# Patient Record
Sex: Female | Born: 1985 | Race: White | Hispanic: No | Marital: Single | Smoking: Former smoker
Health system: Southern US, Community
[De-identification: ages and names within clinical notes are randomized; demographics above are authoritative.]

## PROBLEM LIST (undated history)

## (undated) DIAGNOSIS — E063 Autoimmune thyroiditis: Secondary | ICD-10-CM

## (undated) HISTORY — PX: KNEE ARTHROSCOPY: SUR90

## (undated) HISTORY — PX: TYMPANOSTOMY TUBE PLACEMENT: SHX32

## (undated) HISTORY — PX: TONSILLECTOMY: SUR1361

---
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
## 2007-05-18 ENCOUNTER — Emergency Department (HOSPITAL_COMMUNITY): Admission: EM | Admit: 2007-05-18 | Discharge: 2007-05-18 | Payer: Self-pay | Admitting: Neurology

## 2007-07-10 ENCOUNTER — Ambulatory Visit: Payer: Self-pay | Admitting: Family Medicine

## 2007-09-19 ENCOUNTER — Emergency Department (HOSPITAL_COMMUNITY): Admission: EM | Admit: 2007-09-19 | Discharge: 2007-09-19 | Payer: Self-pay | Admitting: Emergency Medicine

## 2007-10-01 ENCOUNTER — Encounter (INDEPENDENT_AMBULATORY_CARE_PROVIDER_SITE_OTHER): Payer: Self-pay | Admitting: Family Medicine

## 2007-10-01 DIAGNOSIS — E039 Hypothyroidism, unspecified: Secondary | ICD-10-CM | POA: Insufficient documentation

## 2007-10-01 DIAGNOSIS — G43909 Migraine, unspecified, not intractable, without status migrainosus: Secondary | ICD-10-CM | POA: Insufficient documentation

## 2007-10-19 ENCOUNTER — Emergency Department (HOSPITAL_COMMUNITY): Admission: EM | Admit: 2007-10-19 | Discharge: 2007-10-19 | Payer: Self-pay | Admitting: Family Medicine

## 2008-02-10 ENCOUNTER — Ambulatory Visit: Payer: Self-pay | Admitting: Family Medicine

## 2008-02-10 DIAGNOSIS — G4700 Insomnia, unspecified: Secondary | ICD-10-CM | POA: Insufficient documentation

## 2009-01-19 ENCOUNTER — Emergency Department (HOSPITAL_COMMUNITY): Admission: EM | Admit: 2009-01-19 | Discharge: 2009-01-19 | Payer: Self-pay | Admitting: Emergency Medicine

## 2009-07-06 ENCOUNTER — Emergency Department (HOSPITAL_COMMUNITY): Admission: EM | Admit: 2009-07-06 | Discharge: 2009-07-06 | Payer: Self-pay | Admitting: Family Medicine

## 2009-11-20 ENCOUNTER — Emergency Department (HOSPITAL_COMMUNITY): Admission: EM | Admit: 2009-11-20 | Discharge: 2009-11-20 | Payer: Self-pay | Admitting: Family Medicine

## 2010-01-25 ENCOUNTER — Other Ambulatory Visit: Admission: RE | Admit: 2010-01-25 | Discharge: 2010-01-25 | Payer: Self-pay | Admitting: Family Medicine

## 2010-03-06 ENCOUNTER — Emergency Department (HOSPITAL_COMMUNITY): Admission: EM | Admit: 2010-03-06 | Discharge: 2010-03-06 | Payer: Self-pay | Admitting: Emergency Medicine

## 2010-10-23 ENCOUNTER — Emergency Department (HOSPITAL_COMMUNITY): Admission: EM | Admit: 2010-10-23 | Discharge: 2010-10-23 | Payer: Self-pay | Admitting: Family Medicine

## 2011-04-10 LAB — POCT URINALYSIS DIP (DEVICE)
Hgb urine dipstick: NEGATIVE
Protein, ur: 30 mg/dL — AB
Specific Gravity, Urine: 1.02 (ref 1.005–1.030)
Urobilinogen, UA: 0.2 mg/dL (ref 0.0–1.0)
pH: 5.5 (ref 5.0–8.0)

## 2011-04-10 LAB — POCT PREGNANCY, URINE: Preg Test, Ur: NEGATIVE

## 2011-04-23 ENCOUNTER — Emergency Department (HOSPITAL_COMMUNITY)
Admission: EM | Admit: 2011-04-23 | Discharge: 2011-04-23 | Disposition: A | Discharge status: Home / Self Care | Payer: 59 | Attending: Emergency Medicine | Admitting: Emergency Medicine

## 2011-04-23 ENCOUNTER — Emergency Department (HOSPITAL_COMMUNITY): Payer: 59

## 2011-04-23 DIAGNOSIS — Y92009 Unspecified place in unspecified non-institutional (private) residence as the place of occurrence of the external cause: Secondary | ICD-10-CM | POA: Insufficient documentation

## 2011-04-23 DIAGNOSIS — F3289 Other specified depressive episodes: Secondary | ICD-10-CM | POA: Insufficient documentation

## 2011-04-23 DIAGNOSIS — E039 Hypothyroidism, unspecified: Secondary | ICD-10-CM | POA: Insufficient documentation

## 2011-04-23 DIAGNOSIS — M79609 Pain in unspecified limb: Secondary | ICD-10-CM | POA: Insufficient documentation

## 2011-04-23 DIAGNOSIS — F329 Major depressive disorder, single episode, unspecified: Secondary | ICD-10-CM | POA: Insufficient documentation

## 2011-04-23 DIAGNOSIS — W208XXA Other cause of strike by thrown, projected or falling object, initial encounter: Secondary | ICD-10-CM | POA: Insufficient documentation

## 2011-11-27 ENCOUNTER — Emergency Department (INDEPENDENT_AMBULATORY_CARE_PROVIDER_SITE_OTHER)
Admission: EM | Admit: 2011-11-27 | Discharge: 2011-11-27 | Disposition: A | Discharge status: Home / Self Care | Payer: 59 | Source: Home / Self Care | Attending: Family Medicine | Admitting: Family Medicine

## 2011-11-27 DIAGNOSIS — H6980 Other specified disorders of Eustachian tube, unspecified ear: Secondary | ICD-10-CM

## 2011-11-27 DIAGNOSIS — H9203 Otalgia, bilateral: Secondary | ICD-10-CM

## 2011-11-27 DIAGNOSIS — H9209 Otalgia, unspecified ear: Secondary | ICD-10-CM

## 2011-11-27 HISTORY — DX: Autoimmune thyroiditis: E06.3

## 2011-11-27 MED ORDER — AMOXICILLIN-POT CLAVULANATE 875-125 MG PO TABS: 1.0000 | ORAL_TABLET | Freq: Two times a day (BID) | ORAL | Status: AC

## 2011-11-27 MED ORDER — FLUTICASONE PROPIONATE 50 MCG/ACT NA SUSP: 2.0000 | Freq: Every day | NASAL | Status: DC

## 2011-11-27 NOTE — ED Notes (Signed)
3 day hx of sinus pressure with headache.  coughing with no production. 3 day hx of bilateral ear pain.  Denies fevers.

## 2011-11-27 NOTE — ED Provider Notes (Signed)
History     CSN: [id number] Arrival date & time: 11/27/2011 11:35 AM   First MD Initiated Contact with Patient 11/27/11 1004      Chief Complaint  Patient presents with  . Otalgia    3 day hx of bilateral ear pain.    . Sinus Problem    3 day hx of sinus pressure with headache.  coughing with no production.      (Consider location/radiation/quality/duration/timing/severity/associated sxs/prior treatment) HPI Comments: Leah Simpson presents for evaluation of bilateral ear pain. She reports that she gets ear infections frequently. She denies any fever.   Patient is a 25 y.o. female presenting with ear pain and sinus complaint. The history is provided by the patient.  Otalgia This is a new problem. The current episode started 2 days ago. There is pain in both ears. The problem occurs constantly. The problem has been gradually worsening. There has been no fever. Associated symptoms include headaches, hearing loss and rhinorrhea. Pertinent negatives include no ear discharge. Her past medical history is significant for chronic ear infection.  Sinus Problem Associated symptoms include headaches.    Past Medical History  Diagnosis Date  . Asthma   . Hashimoto disease     Past Surgical History  Procedure Date  . Tonsillectomy   . Tympanostomy tube placement   . Knee arthroscopy     Family History  Problem Relation Age of Onset  . Heart failure Other   . Cancer Other   . Diabetes Other     History  Substance Use Topics  . Smoking status: Never Smoker   . Smokeless tobacco: Never Used  . Alcohol Use: Yes     occational    OB History    Grav Para Term Preterm Abortions TAB SAB Ect Mult Living                  Review of Systems  Constitutional: Negative.   HENT: Positive for hearing loss, ear pain, congestion, rhinorrhea and sinus pressure. Negative for ear discharge.   Eyes: Negative.   Respiratory: Negative.   Cardiovascular: Negative.   Gastrointestinal: Negative.     Genitourinary: Negative.   Musculoskeletal: Negative.   Skin: Negative.   Neurological: Positive for headaches.    Allergies  Zoloft; Nitrofurantoin; and Propoxyphene n-acetaminophen  Home Medications   Current Outpatient Rx  Name Route Sig Dispense Refill  . BUPROPION HCL 75 MG PO TABS Oral Take 75 mg by mouth 1 day or 1 dose.      Marland Kitchen CITALOPRAM HYDROBROMIDE 40 MG PO TABS Oral Take 40 mg by mouth daily.      Marland Kitchen LEVOTHYROXINE SODIUM 137 MCG PO TABS Oral Take 137 mcg by mouth daily.      Maximino Greenland 18-103 MCG/ACT IN AERO Inhalation Inhale 2 puffs into the lungs every 6 (six) hours as needed.        BP 115/78  Pulse 81  Temp(Src) 98 F (36.7 C) (Oral)  Resp 20  SpO2 98%  Physical Exam  Nursing note and vitals reviewed. Constitutional: She is oriented to person, place, and time. She appears well-developed and well-nourished.  HENT:  Head: Normocephalic and atraumatic.  Right Ear: There is tenderness. No drainage. Tympanic membrane is retracted. Tympanic membrane is not erythematous. Tympanic membrane mobility is abnormal. Decreased hearing is noted.  Left Ear: There is tenderness. No drainage. Tympanic membrane is retracted. Tympanic membrane is not erythematous. Tympanic membrane mobility is abnormal. Decreased hearing is noted.  Nose: Nose normal.  Mouth/Throat: Uvula is midline, oropharynx is clear and moist and mucous membranes are normal.  Eyes: EOM are normal. Pupils are equal, round, and reactive to light.  Neck: Normal range of motion.  Cardiovascular: Normal rate and regular rhythm.   Pulmonary/Chest: Effort normal and breath sounds normal.  Neurological: She is alert and oriented to person, place, and time.  Skin: Skin is warm and dry.    ED Course  Procedures (including critical care time)  Labs Reviewed - No data to display No results found.   No diagnosis found.    MDM  Eustachian tube dysfunction        Richardo Priest, MD 11/27/11  1229

## 2012-02-01 ENCOUNTER — Other Ambulatory Visit: Payer: Self-pay | Admitting: Obstetrics and Gynecology

## 2012-02-01 ENCOUNTER — Other Ambulatory Visit (HOSPITAL_COMMUNITY)
Admission: RE | Admit: 2012-02-01 | Discharge: 2012-02-01 | Disposition: A | Discharge status: Home / Self Care | Payer: 59 | Source: Ambulatory Visit | Attending: Obstetrics and Gynecology | Admitting: Obstetrics and Gynecology

## 2012-02-01 DIAGNOSIS — Z01419 Encounter for gynecological examination (general) (routine) without abnormal findings: Secondary | ICD-10-CM | POA: Insufficient documentation

## 2012-02-01 DIAGNOSIS — Z113 Encounter for screening for infections with a predominantly sexual mode of transmission: Secondary | ICD-10-CM | POA: Insufficient documentation

## 2013-01-24 ENCOUNTER — Other Ambulatory Visit: Payer: Self-pay | Admitting: Family Medicine

## 2013-01-24 DIAGNOSIS — R102 Pelvic and perineal pain: Secondary | ICD-10-CM

## 2013-01-27 ENCOUNTER — Ambulatory Visit
Admission: RE | Admit: 2013-01-27 | Discharge: 2013-01-27 | Disposition: A | Discharge status: Home / Self Care | Payer: BC Managed Care – PPO | Source: Ambulatory Visit | Attending: Family Medicine | Admitting: Family Medicine

## 2013-01-27 DIAGNOSIS — R102 Pelvic and perineal pain: Secondary | ICD-10-CM

## 2013-03-08 ENCOUNTER — Emergency Department (HOSPITAL_COMMUNITY): Payer: BC Managed Care – PPO

## 2013-03-08 ENCOUNTER — Encounter (HOSPITAL_COMMUNITY): Payer: Self-pay | Admitting: Emergency Medicine

## 2013-03-08 ENCOUNTER — Emergency Department (HOSPITAL_COMMUNITY)
Admission: EM | Admit: 2013-03-08 | Discharge: 2013-03-08 | Disposition: A | Discharge status: Home / Self Care | Payer: BC Managed Care – PPO | Attending: Emergency Medicine | Admitting: Emergency Medicine

## 2013-03-08 DIAGNOSIS — M549 Dorsalgia, unspecified: Secondary | ICD-10-CM

## 2013-03-08 DIAGNOSIS — M545 Low back pain, unspecified: Secondary | ICD-10-CM | POA: Insufficient documentation

## 2013-03-08 DIAGNOSIS — Z79899 Other long term (current) drug therapy: Secondary | ICD-10-CM | POA: Insufficient documentation

## 2013-03-08 DIAGNOSIS — J45909 Unspecified asthma, uncomplicated: Secondary | ICD-10-CM | POA: Insufficient documentation

## 2013-03-08 DIAGNOSIS — E063 Autoimmune thyroiditis: Secondary | ICD-10-CM | POA: Insufficient documentation

## 2013-03-08 DIAGNOSIS — Z3202 Encounter for pregnancy test, result negative: Secondary | ICD-10-CM | POA: Insufficient documentation

## 2013-03-08 LAB — POCT I-STAT, CHEM 8
BUN: 11 mg/dL (ref 6–23)
Calcium, Ion: 1.2 mmol/L (ref 1.12–1.23)
Chloride: 103 mEq/L (ref 96–112)

## 2013-03-08 LAB — POCT PREGNANCY, URINE: Preg Test, Ur: NEGATIVE

## 2013-03-08 LAB — URINALYSIS, ROUTINE W REFLEX MICROSCOPIC
Bilirubin Urine: NEGATIVE
Glucose, UA: NEGATIVE mg/dL
Hgb urine dipstick: NEGATIVE
Ketones, ur: NEGATIVE mg/dL
Protein, ur: NEGATIVE mg/dL

## 2013-03-08 LAB — URINE MICROSCOPIC-ADD ON

## 2013-03-08 MED ORDER — DIAZEPAM 5 MG PO TABS: 5.0000 mg | ORAL_TABLET | Freq: Four times a day (QID) | ORAL | Status: DC | PRN

## 2013-03-08 MED ORDER — DIAZEPAM 5 MG PO TABS
5.0000 mg | ORAL_TABLET | Freq: Once | ORAL | Status: AC
  Administered 2013-03-08: 5 mg via ORAL
  Filled 2013-03-08: qty 1

## 2013-03-08 MED ORDER — PERCOCET 5-325 MG PO TABS: 1.0000 | ORAL_TABLET | Freq: Four times a day (QID) | ORAL | Status: DC | PRN

## 2013-03-08 MED ORDER — NAPROXEN 500 MG PO TABS: 500.0000 mg | ORAL_TABLET | Freq: Two times a day (BID) | ORAL | Status: DC

## 2013-03-08 MED ORDER — IBUPROFEN 200 MG PO TABS
400.0000 mg | ORAL_TABLET | Freq: Once | ORAL | Status: AC
  Administered 2013-03-08: 400 mg via ORAL
  Filled 2013-03-08: qty 2

## 2013-03-08 MED ORDER — OXYCODONE-ACETAMINOPHEN 5-325 MG PO TABS
1.0000 | ORAL_TABLET | Freq: Once | ORAL | Status: AC
  Administered 2013-03-08: 1 via ORAL
  Filled 2013-03-08: qty 1

## 2013-03-08 NOTE — ED Notes (Signed)
Pt c/o of severe back pain that started x4 days ago. States that she was prescribed muscle relaxants and are taking those with no relief. Pain 10/10. NAD at this time.

## 2013-03-08 NOTE — ED Provider Notes (Signed)
History     CSN: [id number]  Arrival date & time 03/08/13  1721   First MD Initiated Contact with Patient 03/08/13 1738      Chief Complaint  Patient presents with  . Back Pain    (Consider location/radiation/quality/duration/timing/severity/associated sxs/prior treatment) Patient is a 27 y.o. female presenting with back pain. The history is provided by the patient.  Back Pain Location:  Lumbar spine (left prarspinal) Quality:  Aching Radiates to:  Does not radiate Pain severity:  Moderate Pain is:  Same all the time Onset quality:  Sudden Duration:  4 days Timing:  Constant Progression:  Worsening Chronicity:  New Context: physical stress (body habitus )   Context: not emotional stress, not falling, not jumping from heights, not lifting heavy objects, not MCA, not MVA, not occupational injury, not pedestrian accident, not recent illness, not recent injury and not twisting   Relieved by:  Nothing Worsened by:  Ambulation, movement, sitting and lying down Ineffective treatments:  Ibuprofen and muscle relaxants Associated symptoms: no abdominal pain, no abdominal swelling, no bladder incontinence, no bowel incontinence, no chest pain, no dysuria, no fever, no headaches, no leg pain, no paresthesias, no perianal numbness, no tingling, no weakness and no weight loss   Risk factors: lack of exercise and obesity   Risk factors: no hx of cancer and no steroid use     Past Medical History  Diagnosis Date  . Asthma   . Hashimoto disease     Past Surgical History  Procedure Laterality Date  . Tonsillectomy    . Tympanostomy tube placement    . Knee arthroscopy      Family History  Problem Relation Age of Onset  . Heart failure Other   . Cancer Other   . Diabetes Other     History  Substance Use Topics  . Smoking status: Never Smoker   . Smokeless tobacco: Never Used  . Alcohol Use: Yes     Comment: occational    OB History   Grav Para Term Preterm Abortions TAB  SAB Ect Mult Living                  Review of Systems  Constitutional: Negative for fever and weight loss.  Cardiovascular: Negative for chest pain.  Gastrointestinal: Negative for abdominal pain and bowel incontinence.  Genitourinary: Negative for bladder incontinence and dysuria.  Musculoskeletal: Positive for back pain.  Neurological: Negative for tingling, weakness, headaches and paresthesias.  All other systems reviewed and are negative.    Allergies  Sertraline hcl; Augmentin; Nitrofurantoin; and Propoxyphene-acetaminophen  Home Medications   Current Outpatient Rx  Name  Route  Sig  Dispense  Refill  . albuterol-ipratropium (COMBIVENT) 18-103 MCG/ACT inhaler   Inhalation   Inhale 2 puffs into the lungs every 6 (six) hours as needed for shortness of breath.          Marland Kitchen buPROPion (WELLBUTRIN) 75 MG tablet   Oral   Take 75 mg by mouth daily.          . citalopram (CELEXA) 40 MG tablet   Oral   Take 40 mg by mouth at bedtime.          Marland Kitchen levothyroxine (SYNTHROID, LEVOTHROID) 137 MCG tablet   Oral   Take 137 mcg by mouth daily.           . methocarbamol (ROBAXIN) 500 MG tablet   Oral   Take 500 mg by mouth every 6 (six) hours as needed (  for pain.).         Marland Kitchen norgestimate-ethinyl estradiol (SPRINTEC 28) 0.25-35 MG-MCG tablet   Oral   Take 1 tablet by mouth daily.           BP 127/73  Pulse 71  Temp(Src) 97.9 F (36.6 C) (Oral)  Resp 22  SpO2 100%  LMP 02/18/2013  Physical Exam  Nursing note and vitals reviewed. Constitutional: She is oriented to person, place, and time. She appears well-developed and well-nourished. No distress.  HENT:  Head: Normocephalic and atraumatic.  Eyes: Conjunctivae and EOM are normal. Pupils are equal, round, and reactive to light. No scleral icterus.  Neck: Normal range of motion and full passive range of motion without pain. Neck supple. No spinous process tenderness and no muscular tenderness present. No rigidity.  Normal range of motion present. No Brudzinski's sign noted.  Cardiovascular: Normal rate, regular rhythm and intact distal pulses.  Exam reveals no gallop and no friction rub.   No murmur heard. Intact distal pulses, capillary refill less than 3 seconds bilaterally.   Pulmonary/Chest: Effort normal and breath sounds normal. No respiratory distress. She has no wheezes. She has no rales. She exhibits no tenderness.  Abdominal:  Morbidly obese  Musculoskeletal:  Bilateral lower extremities nontender without color change, baseline range of motion of extremities with Pt has increased pain w ROM of lumbar spine. Pain w ambulation. Negative straight leg test bilaterally.   Neurological: She is alert and oriented to person, place, and time. She has normal strength and normal reflexes. No sensory deficit. Abnormal gait: no ataxia, slowed and hunched d/t pain   Sensation at baseline for light touch in all 4 distal extremities, motor symmetric & bilateral 5/5 (hips: abduction, adduction, flexion; knee: flexion & extension; foot: dorsiflexion, plantar flexion, toes: dorsi flexion)  Skin: Skin is warm and dry. No rash noted. She is not diaphoretic. No erythema. No pallor.  Psychiatric: She has a normal mood and affect.    ED Course  Procedures (including critical care time)  Labs Reviewed  URINALYSIS, ROUTINE W REFLEX MICROSCOPIC - Abnormal; Notable for the following:    APPearance TURBID (*)    Specific Gravity, Urine 1.033 (*)    Leukocytes, UA MODERATE (*)    All other components within normal limits  URINE MICROSCOPIC-ADD ON - Abnormal; Notable for the following:    Squamous Epithelial / LPF MANY (*)    Bacteria, UA MANY (*)    All other components within normal limits  POCT I-STAT, CHEM 8 - Abnormal; Notable for the following:    Glucose, Bld 107 (*)    All other components within normal limits  URINE CULTURE  POCT PREGNANCY, URINE   Dg Lumbar Spine Complete  03/08/2013  *RADIOLOGY REPORT*   Clinical Data: Back pain for 4 days.  Pain radiates down the left leg.  LUMBAR SPINE - COMPLETE 4+ VIEW  Comparison: None.  Findings: There are five non-rib bearing vertebral bodies.  There is normal alignment.  There is no evidence for acute fracture or subluxation.  No spondylolysis or spondylolisthesis identified. No significant degenerative changes identified. The visualized portion of the pelvis has a normal appearance.  Visualized bowel gas pattern is nonobstructive.  IMPRESSION: Negative exam.   Original Report Authenticated By: Norva Pavlov, M.D.      No diagnosis found.    MDM  Patient with back pain.  No neurological deficits and normal neuro exam.  Patient can walk but states is painful.  No loss of  bowel or bladder control.  No concern for cauda equina.  No fever, night sweats, weight loss, h/o cancer, IVDU.  RICE protocol and pain medicine indicated and discussed with patient.          Jaci Carrel, [place] 03/08/13 1921

## 2013-03-09 NOTE — ED Provider Notes (Signed)
Medical screening examination/treatment/procedure(s) were performed by non-physician practitioner and as supervising physician I was immediately available for consultation/collaboration.  Doug Sou, MD 03/09/13 [phone]

## 2013-03-10 LAB — URINE CULTURE: Colony Count: 85000

## 2013-09-06 ENCOUNTER — Emergency Department (HOSPITAL_COMMUNITY)
Admission: EM | Admit: 2013-09-06 | Discharge: 2013-09-06 | Disposition: A | Discharge status: Home / Self Care | Payer: BC Managed Care – PPO | Attending: Emergency Medicine | Admitting: Emergency Medicine

## 2013-09-06 ENCOUNTER — Encounter (HOSPITAL_COMMUNITY): Payer: Self-pay | Admitting: *Deleted

## 2013-09-06 DIAGNOSIS — Z79899 Other long term (current) drug therapy: Secondary | ICD-10-CM | POA: Insufficient documentation

## 2013-09-06 DIAGNOSIS — R51 Headache: Secondary | ICD-10-CM | POA: Insufficient documentation

## 2013-09-06 DIAGNOSIS — R059 Cough, unspecified: Secondary | ICD-10-CM | POA: Insufficient documentation

## 2013-09-06 DIAGNOSIS — R042 Hemoptysis: Secondary | ICD-10-CM | POA: Insufficient documentation

## 2013-09-06 DIAGNOSIS — R519 Headache, unspecified: Secondary | ICD-10-CM

## 2013-09-06 DIAGNOSIS — B085 Enteroviral vesicular pharyngitis: Secondary | ICD-10-CM | POA: Insufficient documentation

## 2013-09-06 DIAGNOSIS — R05 Cough: Secondary | ICD-10-CM | POA: Insufficient documentation

## 2013-09-06 DIAGNOSIS — Z8639 Personal history of other endocrine, nutritional and metabolic disease: Secondary | ICD-10-CM | POA: Insufficient documentation

## 2013-09-06 DIAGNOSIS — Z862 Personal history of diseases of the blood and blood-forming organs and certain disorders involving the immune mechanism: Secondary | ICD-10-CM | POA: Insufficient documentation

## 2013-09-06 DIAGNOSIS — J45909 Unspecified asthma, uncomplicated: Secondary | ICD-10-CM | POA: Insufficient documentation

## 2013-09-06 MED ORDER — SODIUM CHLORIDE 0.9 % IV BOLUS (SEPSIS): 1000.0000 mL | Freq: Once | INTRAVENOUS | Status: DC

## 2013-09-06 MED ORDER — DIPHENHYDRAMINE HCL 50 MG/ML IJ SOLN
25.0000 mg | Freq: Once | INTRAMUSCULAR | Status: AC
  Administered 2013-09-06: 25 mg via INTRAVENOUS
  Filled 2013-09-06: qty 1

## 2013-09-06 MED ORDER — METOCLOPRAMIDE HCL 5 MG/ML IJ SOLN
5.0000 mg | Freq: Once | INTRAMUSCULAR | Status: AC
  Administered 2013-09-06: 5 mg via INTRAVENOUS
  Filled 2013-09-06: qty 2

## 2013-09-06 NOTE — ED Notes (Signed)
Pt reports onset of headache yesterday, has taken naproxen and benadryl yesterday without relief. Reports light sensitivity, denies n/v. Pt also reports "red spots" to the back of her throat. Denies pain or sore throat. States today she had 1 episode of productive cough, states she coughed up red mucous and thinks it may be blood.

## 2013-09-06 NOTE — ED Notes (Signed)
Went to pt's room to start an PIV, pt stated "I would prefer if you can just use the muscle, I am very hard stick." Dr Wilkie Aye notified and verbal order obtained to administer medications IM.

## 2013-09-06 NOTE — ED Provider Notes (Signed)
CSN: [id number]     Arrival date & time 09/06/13  1034 History   First MD Initiated Contact with Patient 09/06/13 1112     Chief Complaint  Patient presents with  . Migraine  . Hemoptysis   (Consider location/radiation/quality/duration/timing/severity/associated sxs/prior Treatment)   this is a 27 year old female who presents with headache, cough, and mouth sores. Patient reports headache since yesterday. She has a history of headaches. She took naproxen and Benadryl without any relief. She endorses sensitivity to sound but not light. The headache is located bitemporally. Patient noted "red spots" to the back of her throat yesterday. She denies any pain or sore throat. She developed a cough today and states that she feels congested. She did cough up a plug of mucus that had a reddish tent to it.  Patient denies any fevers, shortness of breath, chest pain, abdominal pain, urinary symptoms, focal weakness or numbness.  Past Medical History  Diagnosis Date  . Asthma   . Hashimoto disease    Past Surgical History  Procedure Laterality Date  . Tonsillectomy    . Tympanostomy tube placement    . Knee arthroscopy     Family History  Problem Relation Age of Onset  . Heart failure Other   . Cancer Other   . Diabetes Other    History  Substance Use Topics  . Smoking status: Never Smoker   . Smokeless tobacco: Never Used  . Alcohol Use: Yes     Comment: occational   OB History   Grav Para Term Preterm Abortions TAB SAB Ect Mult Living                 Review of Systems  Constitutional: Negative for fever.  HENT: Positive for mouth sores. Negative for ear pain, sore throat and trouble swallowing.   Respiratory: Negative for cough, chest tightness and shortness of breath.   Cardiovascular: Negative for chest pain.  Gastrointestinal: Negative for nausea, vomiting and abdominal pain.  Genitourinary: Negative for dysuria.  Musculoskeletal: Negative for back pain.  Skin: Negative for  rash.  Neurological: Positive for headaches. Negative for syncope and weakness.  Psychiatric/Behavioral: The patient is not nervous/anxious.   All other systems reviewed and are negative.    Allergies  Sertraline hcl; Augmentin; Nitrofurantoin; and Propoxyphene-acetaminophen  Home Medications   Current Outpatient Rx  Name  Route  Sig  Dispense  Refill  . levothyroxine (SYNTHROID, LEVOTHROID) 137 MCG tablet   Oral   Take 137 mcg by mouth daily.            BP 122/71  Pulse 75  Temp(Src) 98.5 F (36.9 C) (Oral)  Resp 14  SpO2 98%  LMP 08/18/2013 Physical Exam  Nursing note and vitals reviewed. Constitutional: She is oriented to person, place, and time. She appears well-developed and well-nourished. No distress.  HENT:  Head: Normocephalic and atraumatic.  2 erythematous lesions over the hard palate, no vesicles noted, no exudate or tonsillar enlargement  Eyes: Pupils are equal, round, and reactive to light.  Neck: Neck supple.  Cardiovascular: Normal rate, regular rhythm and normal heart sounds.   Pulmonary/Chest: Effort normal and breath sounds normal. No respiratory distress. She has no wheezes.  Abdominal: Soft. Bowel sounds are normal. She exhibits no distension. There is no tenderness.  Lymphadenopathy:    She has no cervical adenopathy.  Neurological: She is alert and oriented to person, place, and time.  Skin: Skin is warm and dry.  Psychiatric: She has a normal mood and  affect.    ED Course  Procedures (including critical care time) Labs Review Labs Reviewed - No data to display Imaging Review No results found.  MDM   1. Headache   2. Herpangina    This is a 27 year old female who presents with headache and mouth sores. Patient is nontoxic-appearing on exam her vital signs are within normal limits. Patient has evidence of early herpangina. Regarding the patient's cough and concerns for bloating these risks, she showed me a picture of what she coughed up.  It appeared to be a mucous plug with  scant blood. Patient was given Reglan and Benadryl for her headache. She had improvement of her symptoms. At this time I feel her constellation of symptoms are most consistent with an acute viral syndrome. She is not on anti-anticoagulant that would put her at risk for massive hemoptysis. The scant blood in her mucus is most likely secondary to abrasion or irritation secondary to cough.  Patient will be discharged home.  After history, exam, and medical workup I feel the patient has been appropriately medically screened and is safe for discharge home. Pertinent diagnoses were discussed with the patient. Patient was given return precautions.  Shon Baton, MD 09/06/13 1309

## 2014-03-23 IMAGING — CR DG LUMBAR SPINE COMPLETE 4+V
5 series · 5 of 5 positions shown · non-contrast
Comparison: None.

CLINICAL DATA: Back pain for 4 days.  Pain radiates down the left
leg.

LUMBAR SPINE - COMPLETE 4+ VIEW

[t lumbar spine ap]
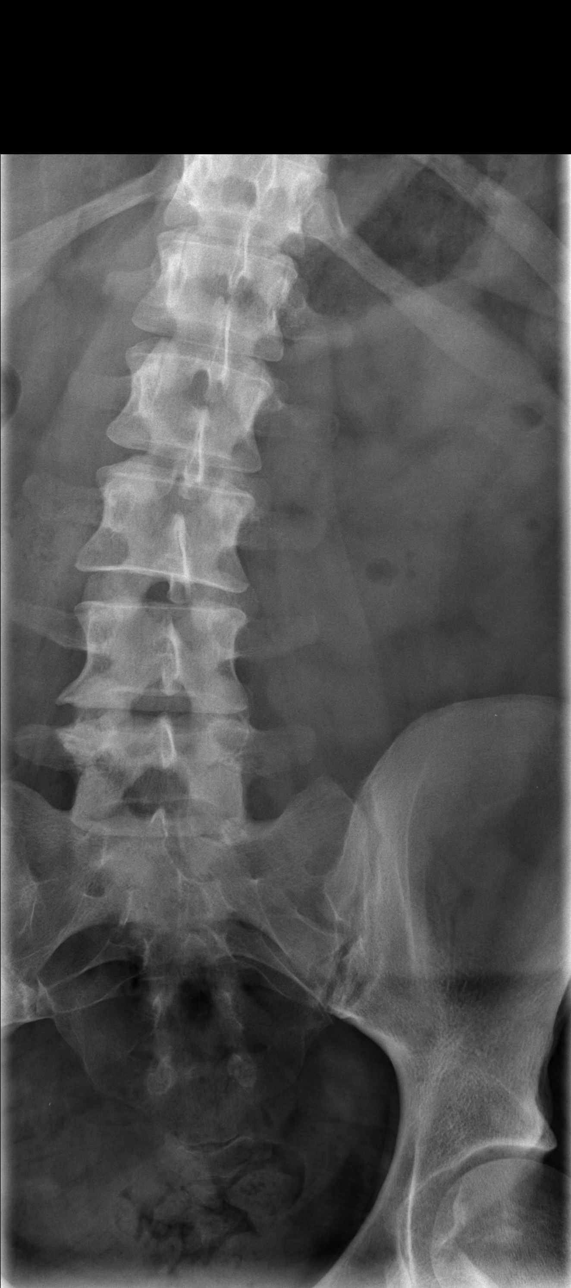

[t lumbar spine obl (1 of 2)]
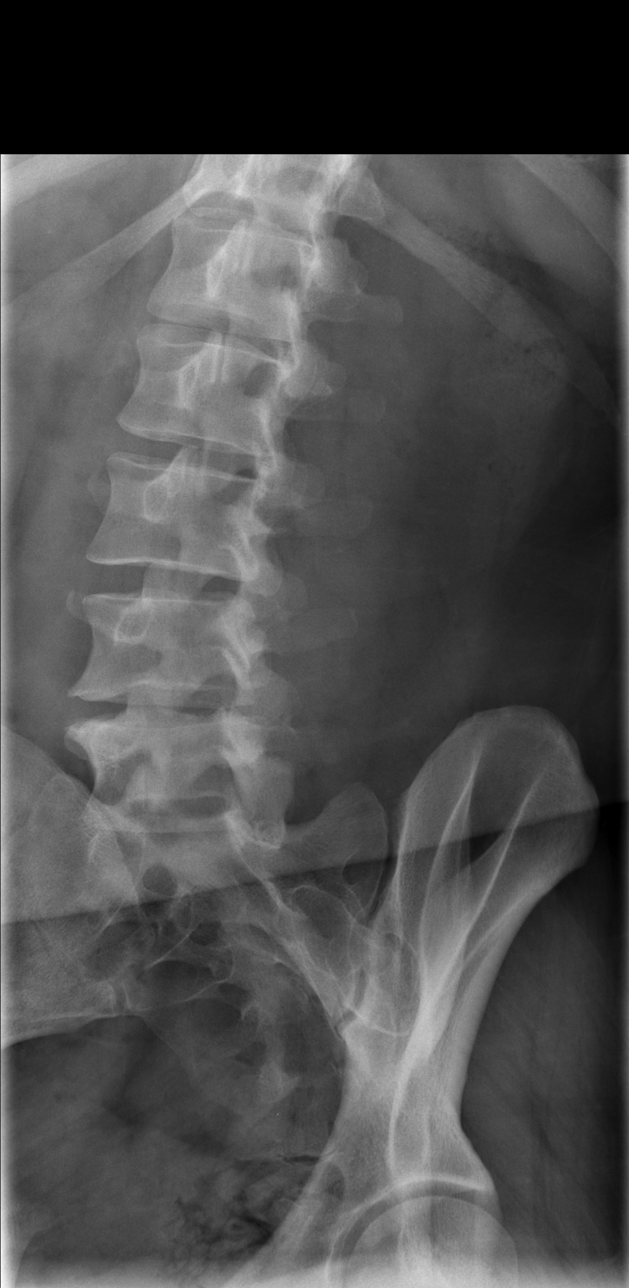

[t lumbar spine obl (2 of 2)]
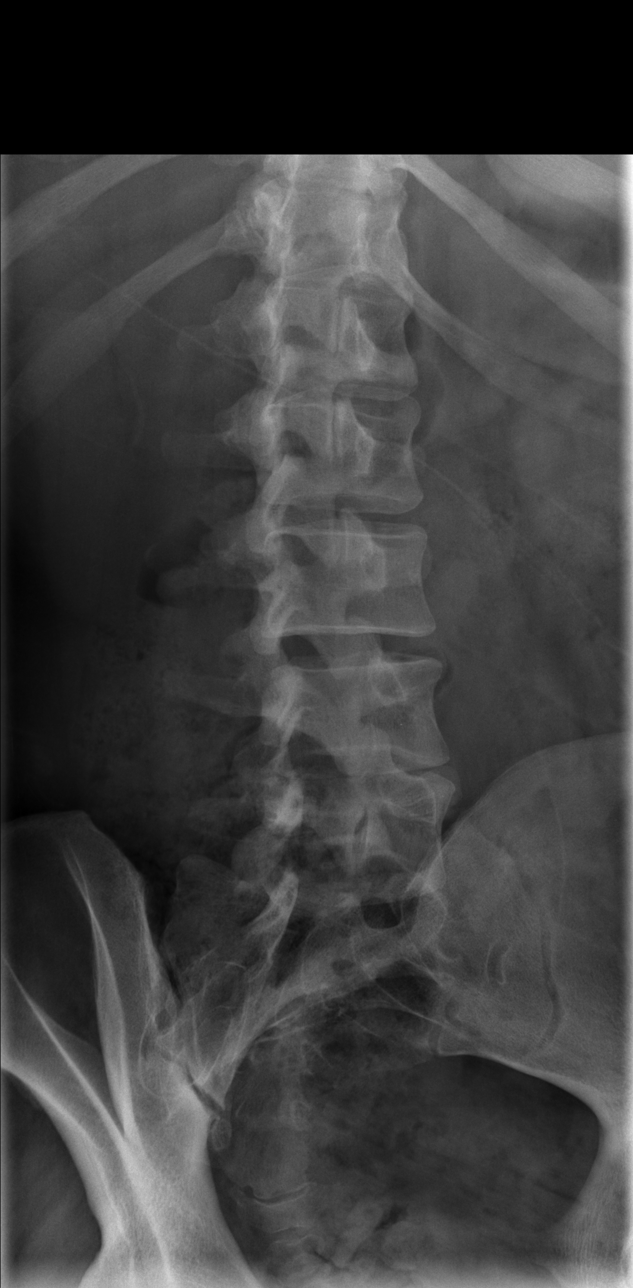

[t lumbar spine lat]
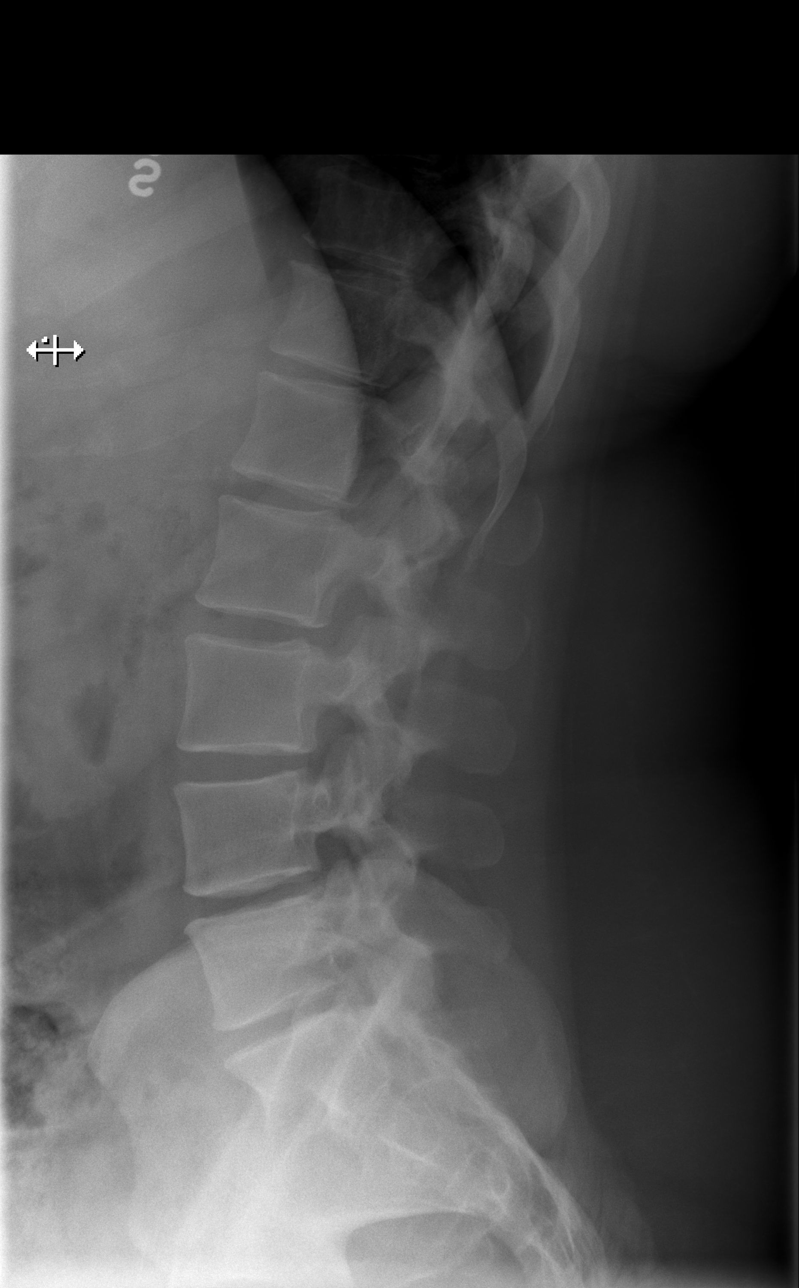

[t lumbar l-5 s-1 spot]
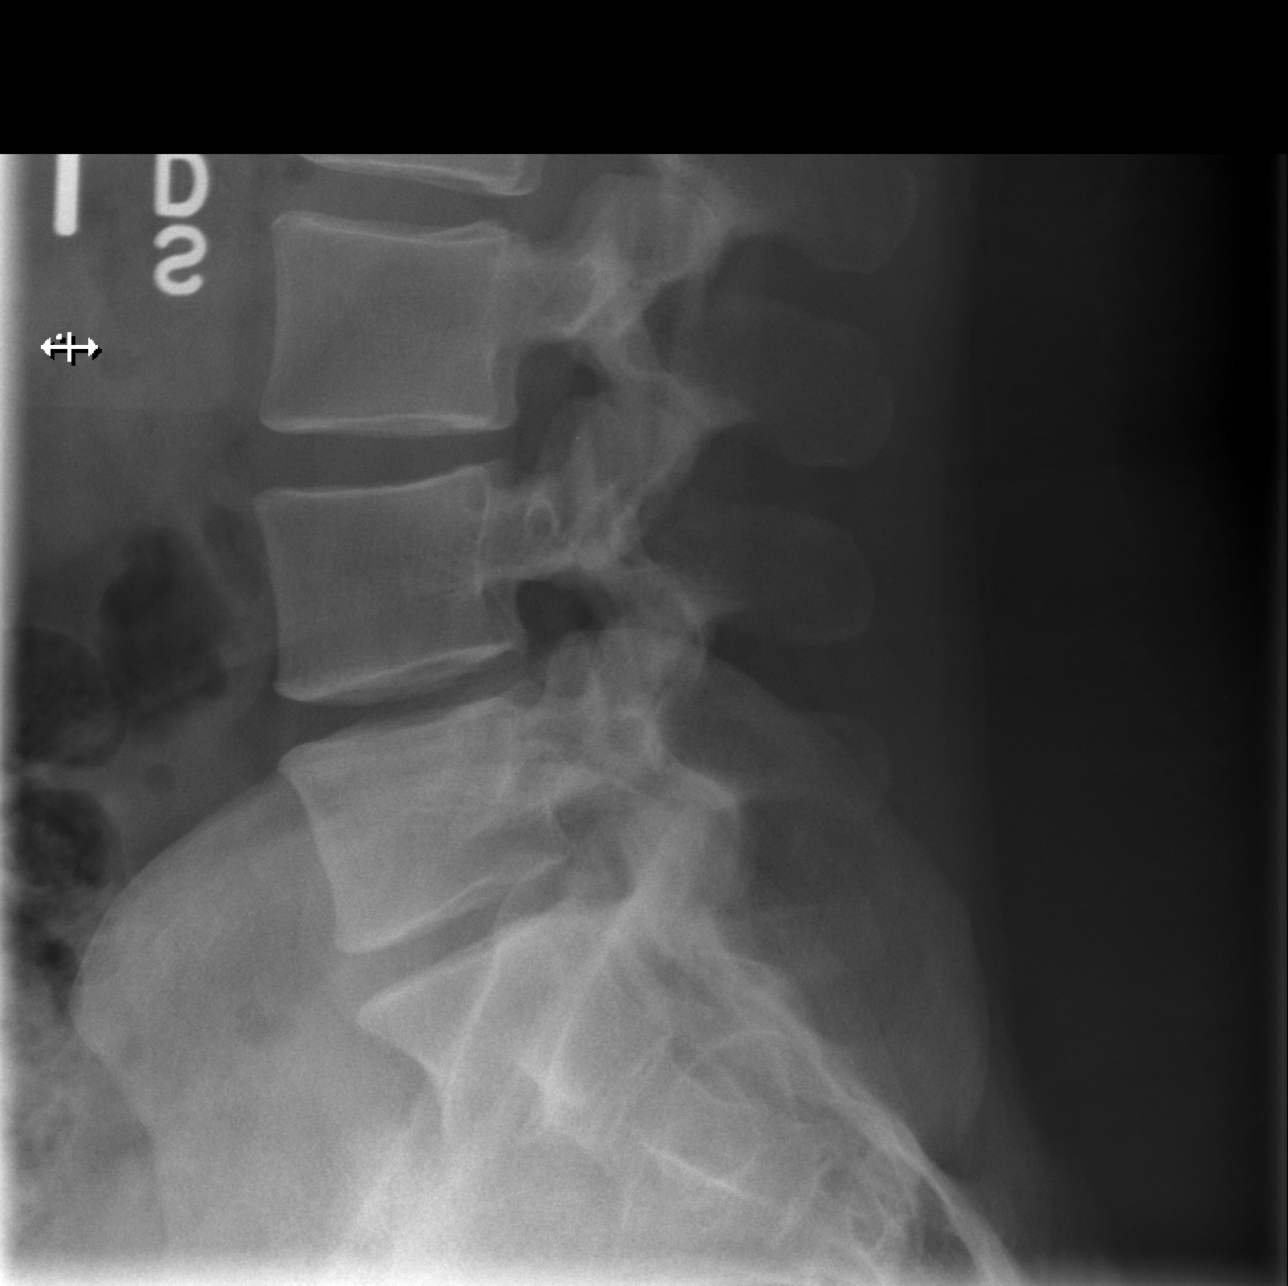

[5 of 5 positions shown; findings below may reference images not displayed]

FINDINGS: There are five non-rib bearing vertebral bodies.  There
is normal alignment.  There is no evidence for acute fracture or
subluxation.  No spondylolysis or spondylolisthesis identified. No
significant degenerative changes identified. The visualized portion
of the pelvis has a normal appearance.  Visualized bowel gas
pattern is nonobstructive.
IMPRESSION: Negative exam.

## 2014-06-11 ENCOUNTER — Encounter: Payer: Self-pay | Admitting: Emergency Medicine

## 2014-06-11 ENCOUNTER — Emergency Department
Admission: EM | Admit: 2014-06-11 | Discharge: 2014-06-11 | Disposition: A | Discharge status: Home / Self Care | Payer: BC Managed Care – PPO | Source: Home / Self Care | Attending: Emergency Medicine | Admitting: Emergency Medicine

## 2014-06-11 DIAGNOSIS — J45901 Unspecified asthma with (acute) exacerbation: Secondary | ICD-10-CM

## 2014-06-11 MED ORDER — METHYLPREDNISOLONE ACETATE 80 MG/ML IJ SUSP
80.0000 mg | Freq: Once | INTRAMUSCULAR | Status: AC
  Administered 2014-06-11: 80 mg via INTRAMUSCULAR

## 2014-06-11 MED ORDER — IPRATROPIUM-ALBUTEROL 0.5-2.5 (3) MG/3ML IN SOLN
3.0000 mL | Freq: Once | RESPIRATORY_TRACT | Status: AC
  Administered 2014-06-11: 3 mL via RESPIRATORY_TRACT

## 2014-06-11 MED ORDER — PREDNISONE 20 MG PO TABS: 20.0000 mg | ORAL_TABLET | Freq: Two times a day (BID) | ORAL | Status: DC

## 2014-06-11 NOTE — ED Notes (Signed)
SOB started last evening.  Used rescue inhaler about 30 mins. Ago without relief. Hands swollen since yesterday.  No other s/s of resp infection.  Productive cough normal for her.  Rash on L forearm fading.

## 2014-06-11 NOTE — ED Provider Notes (Signed)
CSN: [id number]     Arrival date & time 06/11/14  1124 History   First MD Initiated Contact with Patient 06/11/14 1156     Chief Complaint  Patient presents with  . Shortness of Breath   (Consider location/radiation/quality/duration/timing/severity/associated sxs/prior Treatment) Patient is a 28 y.o. female presenting with shortness of breath. The history is provided by the patient (And her fianc).  Shortness of Breath Progression:  Waxing and waning Chronicity:  Recurrent Context: known allergens and weather changes (Hot and humid outside. Temperature in the 90s outside today)   Associated symptoms: rash and wheezing   Associated symptoms: no abdominal pain, no chest pain, no claudication, no diaphoresis, no ear pain, no fever, no syncope and no vomiting   Risk factors: no hx of PE/DVT and no prolonged immobilization    History of asthma. (had severe asthma as a child).  Had mild shortness of breath started last evening before going to sleep.  Awoke at 10 AM today with severe shortness of breath. Tried rescue inhaler without significant help. She admits to smoking marijuana recreationally occasionally, and did so last night which may have triggered this, in addition to other environmental allergies, and it's a very hot and humid day. She had a pruritic raised rash left arm yesterday that's improving today. Only minimally pruritic now. No dysphasia or other ENT symptoms. She admits to feeling chronically stressed. Denies history of panic attacks. Past Medical History  Diagnosis Date  . Asthma   . Hashimoto disease    Past Surgical History  Procedure Laterality Date  . Tonsillectomy    . Tympanostomy tube placement    . Knee arthroscopy     Family History  Problem Relation Age of Onset  . Heart failure Other   . Cancer Other   . Diabetes Other   . Stroke Other    History  Substance Use Topics  . Smoking status: Former Games developermoker  . Smokeless tobacco: Never Used  . Alcohol  Use: Yes     Comment: occational   OB History   Grav Para Term Preterm Abortions TAB SAB Ect Mult Living                 Review of Systems  Constitutional: Negative for fever and diaphoresis.  HENT: Negative for ear pain.   Respiratory: Positive for shortness of breath and wheezing.   Cardiovascular: Negative for chest pain, claudication and syncope.  Gastrointestinal: Negative for vomiting and abdominal pain.  Skin: Positive for rash.  All other systems reviewed and are negative.   Allergies  Sertraline hcl; Augmentin; Imitrex; Nitrofurantoin; and Propoxyphene n-acetaminophen  Home Medications   Prior to Admission medications   Medication Sig Start Date End Date Taking? Authorizing Provider  albuterol (PROVENTIL HFA;VENTOLIN HFA) 108 (90 BASE) MCG/ACT inhaler Inhale 2 puffs into the lungs every 6 (six) hours as needed for wheezing or shortness of breath.   Yes Historical Provider, MD  drospirenone-ethinyl estradiol (YAZ,GIANVI,LORYNA) 3-0.02 MG tablet Take 1 tablet by mouth daily.   Yes Historical Provider, MD  levothyroxine (SYNTHROID, LEVOTHROID) 137 MCG tablet Take 137 mcg by mouth daily.      Historical Provider, MD  predniSONE (DELTASONE) 20 MG tablet Take 1 tablet (20 mg total) by mouth 2 (two) times daily with a meal. X 5 days 06/11/14   Lajean Manesavid Massey, MD   BP 114/85  Pulse 86  Temp(Src) 98.2 F (36.8 C) (Oral)  Resp 20  Ht 5\' 7"  (1.702 m)  Wt 299 lb 12  oz (135.966 kg)  BMI 46.94 kg/m2  SpO2 98%  LMP 05/23/2014 Physical Exam  Nursing note and vitals reviewed. Constitutional: She is oriented to person, place, and time. She appears well-developed and well-nourished. She appears distressed (From shortness of breath).  Pleasant overweight female. Dyspneic  HENT:  Head: Normocephalic and atraumatic.  Right Ear: Tympanic membrane normal.  Left Ear: Tympanic membrane normal.  Nose: Nose normal.  Mouth/Throat: Oropharynx is clear and moist. No oral lesions. No  oropharyngeal exudate, posterior oropharyngeal edema or posterior oropharyngeal erythema.  Eyes: Right eye exhibits no discharge. Left eye exhibits no discharge. No scleral icterus.  Neck: Neck supple.  Cardiovascular: Normal rate, regular rhythm, normal heart sounds and normal pulses.   No murmur heard. Pulmonary/Chest: No respiratory distress. She has decreased breath sounds. She has wheezes (Late expiratory throughout). She has no rhonchi. She has no rales.  Musculoskeletal:  No cyanosis clubbing or edema.  Legs and calves nontender without cords  Lymphadenopathy:    She has no cervical adenopathy.  Neurological: She is alert and oriented to person, place, and time. No cranial nerve deficit.  Skin: Skin is warm and dry. No bruising noted. She is not diaphoretic.     Faint raised maculopapular rash that blanches left forearm. No vesicles or papules or red streaks.  Neurovascular distally intact   Psychiatric: She is not actively hallucinating. Thought content is not delusional.  Mildly anxious    ED Course  Procedures (including critical care time) Labs Review Labs Reviewed - No data to display  Imaging Review No results found.   MDM   1. Asthma attack    Treatment options discussed, as well as risks, benefits, alternatives. Patient voiced understanding and agreement with the following plans:  DuoNeb nebulizer treatment given. Wheezing improved. Depo-Medrol 80 mg IM Prednisone 20 mg by mouth twice a day, start tomorrow Albuterol HFA every 4-6 hours when necessary rescue inhaler. Avoid smoke and other allergens. Stay inside, in air conditioning, away from hot and humid weather.  Follow-up with your primary care doctor in 5-7 days if not improving, or sooner if symptoms become worse. Precautions discussed. Red flags discussed. Questions invited and answered. Patient voiced understanding and agreement.     Lajean Manesavid Massey, MD 06/11/14 [phone]

## 2014-12-03 ENCOUNTER — Telehealth: Payer: Self-pay | Admitting: Obstetrics and Gynecology

## 2014-12-03 ENCOUNTER — Inpatient Hospital Stay (HOSPITAL_COMMUNITY)
Admission: AD | Admit: 2014-12-03 | Discharge: 2014-12-04 | Disposition: A | Discharge status: Home / Self Care | Payer: BC Managed Care – PPO | Source: Ambulatory Visit | Attending: Obstetrics & Gynecology | Admitting: Obstetrics & Gynecology

## 2014-12-03 ENCOUNTER — Encounter (HOSPITAL_COMMUNITY): Payer: Self-pay | Admitting: *Deleted

## 2014-12-03 DIAGNOSIS — Z3A08 8 weeks gestation of pregnancy: Secondary | ICD-10-CM | POA: Insufficient documentation

## 2014-12-03 DIAGNOSIS — Z87891 Personal history of nicotine dependence: Secondary | ICD-10-CM | POA: Diagnosis not present

## 2014-12-03 DIAGNOSIS — J45909 Unspecified asthma, uncomplicated: Secondary | ICD-10-CM | POA: Diagnosis not present

## 2014-12-03 DIAGNOSIS — O209 Hemorrhage in early pregnancy, unspecified: Secondary | ICD-10-CM

## 2014-12-03 DIAGNOSIS — O039 Complete or unspecified spontaneous abortion without complication: Secondary | ICD-10-CM | POA: Insufficient documentation

## 2014-12-03 LAB — URINE MICROSCOPIC-ADD ON

## 2014-12-03 LAB — URINALYSIS, ROUTINE W REFLEX MICROSCOPIC
Bilirubin Urine: NEGATIVE
Glucose, UA: NEGATIVE mg/dL
Ketones, ur: NEGATIVE mg/dL
LEUKOCYTES UA: NEGATIVE
NITRITE: NEGATIVE
PH: 7.5 (ref 5.0–8.0)
Protein, ur: NEGATIVE mg/dL
SPECIFIC GRAVITY, URINE: 1.015 (ref 1.005–1.030)
Urobilinogen, UA: 0.2 mg/dL (ref 0.0–1.0)

## 2014-12-03 LAB — POCT PREGNANCY, URINE: Preg Test, Ur: NEGATIVE

## 2014-12-03 NOTE — Telephone Encounter (Signed)
TC from pt. States had faintly positive UPT on 2 Dec 15 and was told that she did not have a viable pregnancy. Tonight reports "pressure in my uterus, a lot of bleeding and passing of blood clots, but no pain." Advised pt to come to [facility] to be evaluated.   By Sherre ScarletKimberly Kenleigh Toback, CNM

## 2014-12-03 NOTE — [facility] Note (Addendum)
PT SAYS  SHE WENT  TO DR   COLE-    LABS-  POS  PREG   THEN  AGAIN ON 12-4-  LABS  RESULTS   DECREASED.         TONIGHT   SHE FEELS   NAUSEA  AND  BLEEDING   AND PRESSURE.    HAS TAMPON IN -   SO TO B-ROOM- REMOVE  TAMPON-   SMALL AMT LIGHT RED  BLOOD -  WITH  STRINGS CLOTS.   GAVE PAD

## 2014-12-04 ENCOUNTER — Inpatient Hospital Stay (HOSPITAL_COMMUNITY): Payer: BC Managed Care – PPO

## 2014-12-04 DIAGNOSIS — O039 Complete or unspecified spontaneous abortion without complication: Secondary | ICD-10-CM | POA: Diagnosis not present

## 2014-12-04 LAB — CBC
HCT: 32.9 % — ABNORMAL LOW (ref 36.0–46.0)
Hemoglobin: 10.2 g/dL — ABNORMAL LOW (ref 12.0–15.0)
MCH: 22.4 pg — ABNORMAL LOW (ref 26.0–34.0)
MCHC: 31 g/dL (ref 30.0–36.0)
MCV: 72.3 fL — ABNORMAL LOW (ref 78.0–100.0)
Platelets: 309 10*3/uL (ref 150–400)
RBC: 4.55 MIL/uL (ref 3.87–5.11)
RDW: 16.4 % — ABNORMAL HIGH (ref 11.5–15.5)
WBC: 11.3 10*3/uL — ABNORMAL HIGH (ref 4.0–10.5)

## 2014-12-04 LAB — HCG, QUANTITATIVE, PREGNANCY: hCG, Beta Chain, Quant, S: 9 m[IU]/mL — ABNORMAL HIGH (ref <5)

## 2014-12-04 LAB — ABO/RH: ABO/RH(D): O POS

## 2014-12-04 NOTE — Discharge Instructions (Signed)
Miscarriage A miscarriage is the sudden loss of an unborn baby (fetus) before the 20th week of pregnancy. Most miscarriages happen in the first 3 months of pregnancy. Sometimes, it happens before a woman even knows she is pregnant. A miscarriage is also called a "spontaneous miscarriage" or "early pregnancy loss." Having a miscarriage can be an emotional experience. Talk with your caregiver about any questions you may have about miscarrying, the grieving process, and your future pregnancy plans. CAUSES   Problems with the fetal chromosomes that make it impossible for the baby to develop normally. Problems with the baby's genes or chromosomes are most often the result of errors that occur, by chance, as the embryo divides and grows. The problems are not inherited from the parents.  Infection of the cervix or uterus.   Hormone problems.   Problems with the cervix, such as having an incompetent cervix. This is when the tissue in the cervix is not strong enough to hold the pregnancy.   Problems with the uterus, such as an abnormally shaped uterus, uterine fibroids, or congenital abnormalities.   Certain medical conditions.   Smoking, drinking alcohol, or taking illegal drugs.   Trauma.  Often, the cause of a miscarriage is unknown.  SYMPTOMS   Vaginal bleeding or spotting, with or without cramps or pain.  Pain or cramping in the abdomen or lower back.  Passing fluid, tissue, or blood clots from the vagina. DIAGNOSIS  Your caregiver will perform a physical exam. You may also have an ultrasound to confirm the miscarriage. Blood or urine tests may also be ordered. TREATMENT   Sometimes, treatment is not necessary if you naturally pass all the fetal tissue that was in the uterus. If some of the fetus or placenta remains in the body (incomplete miscarriage), tissue left behind may become infected and must be removed. Usually, a dilation and curettage (D and C) procedure is performed.  During a D and C procedure, the cervix is widened (dilated) and any remaining fetal or placental tissue is gently removed from the uterus.  Antibiotic medicines are prescribed if there is an infection. Other medicines may be given to reduce the size of the uterus (contract) if there is a lot of bleeding.  If you have Rh negative blood and your baby was Rh positive, you will need a Rh immunoglobulin shot. This shot will protect any future baby from having Rh blood problems in future pregnancies. HOME CARE INSTRUCTIONS   Your caregiver may order bed rest or may allow you to continue light activity. Resume activity as directed by your caregiver.  Have someone help with home and family responsibilities during this time.   Keep track of the number of sanitary pads you use each day and how soaked (saturated) they are. Write down this information.   Do not use tampons. Do not douche or have sexual intercourse until approved by your caregiver.   Only take over-the-counter or prescription medicines for pain or discomfort as directed by your caregiver.   Do not take aspirin. Aspirin can cause bleeding.   Keep all follow-up appointments with your caregiver.   If you or your partner have problems with grieving, talk to your caregiver or seek counseling to help cope with the pregnancy loss. Allow enough time to grieve before trying to get pregnant again.  SEEK IMMEDIATE MEDICAL CARE IF:   You have severe cramps or pain in your back or abdomen.  You have a fever.  You pass large blood clots (walnut-sized   or larger) ortissue from your vagina. Save any tissue for your caregiver to inspect.   Your bleeding increases.   You have a thick, bad-smelling vaginal discharge.  You become lightheaded, weak, or you faint.   You have chills.  MAKE SURE YOU:  Understand these instructions.  Will watch your condition.  Will get help right away if you are not doing well or get  worse. Document Released: 06/06/2001 Document Revised: 04/07/2013 Document Reviewed: 01/30/2012 [employer] Patient Information 2015 [employer]. This information is not intended to replace advice given to you by your health care provider. Make sure you discuss any questions you have with your health care provider.  

## 2014-12-04 NOTE — [facility] Provider Note (Signed)
History     CSN: [id number]  Arrival date and time: 12/03/14 2244   First Provider Initiated Contact with Patient 12/04/14 0023      No chief complaint on file.  HPI Leah Simpson 28 y.o. G1P0 @[redacted]w[redacted]d  presents to [facility] complaining of having had a miscarriage over the last few days.  Earlier this week, she was seen by Dr. Charlotta Newton and had bloodwork suggesting her pregnancy was not continuing.  She was given expectant management.  Over the last 2 days she has had heavy bleeding and passing tissue.  Today, she is still bleeding, although less, and she has pressure.  The pain/pressure has been 10/10 but is now 7/10. She is somewhat weak with mild nausea.  She denies vomiting, headache, dizziness, chest pain, shortness of breath, dysuria. OB History    Gravida Para Term Preterm AB TAB SAB Ectopic Multiple Living   1               Past Medical History  Diagnosis Date  . Asthma   . Hashimoto disease     Past Surgical History  Procedure Laterality Date  . Tonsillectomy    . Tympanostomy tube placement    . Knee arthroscopy      Family History  Problem Relation Age of Onset  . Heart failure Other   . Cancer Other   . Diabetes Other   . Stroke Other     History  Substance Use Topics  . Smoking status: Former Games developer  . Smokeless tobacco: Never Used  . Alcohol Use: Yes     Comment: occational    Allergies:  Allergies  Allergen Reactions  . Sertraline Hcl Itching and Nausea Only  . Augmentin [Amoxicillin-Pot Clavulanate] Hives  . Imitrex [Sumatriptan]   . Nitrofurantoin     Unknown. Does not remember reaction.   . Propoxyphene N-Acetaminophen Nausea Only    Prescriptions prior to admission  Medication Sig Dispense Refill Last Dose  . albuterol (PROVENTIL HFA;VENTOLIN HFA) 108 (90 BASE) MCG/ACT inhaler Inhale 2 puffs into the lungs every 6 (six) hours as needed for wheezing or shortness of breath.     . drospirenone-ethinyl estradiol (YAZ,GIANVI,LORYNA) 3-0.02 MG tablet  Take 1 tablet by mouth daily.     Marland Kitchen levothyroxine (SYNTHROID, LEVOTHROID) 137 MCG tablet Take 137 mcg by mouth daily.     09/05/2013 at Unknown  . predniSONE (DELTASONE) 20 MG tablet Take 1 tablet (20 mg total) by mouth 2 (two) times daily with a meal. X 5 days 10 tablet 0     ROS Pertinent ROS in HPI Physical Exam   Blood pressure 127/73, pulse 100, temperature 98.9 F (37.2 C), temperature source Oral, resp. rate 20, height 5\' 7"  (1.702 m), weight 307 lb (139.254 kg), last menstrual period 10/08/2014.  Physical Exam  Constitutional: She is oriented to person, place, and time. She appears well-developed and well-nourished.  HENT:  Head: Normocephalic and atraumatic.  Eyes: EOM are normal.  Neck: Normal range of motion.  Cardiovascular: Normal rate and regular rhythm.   Respiratory: Effort normal and breath sounds normal.  GI: Soft. She exhibits no distension. There is no tenderness. There is no rebound and no guarding.  Very obese  Musculoskeletal: Normal range of motion.  Neurological: She is alert and oriented to person, place, and time.  Skin: Skin is warm and dry.  Psychiatric: She has a normal mood and affect.   Results for orders placed or performed during the hospital encounter of 12/03/14 (from  the past 24 hour(s))  Urinalysis, Routine w reflex microscopic     Status: Abnormal   Collection Time: 12/03/14 11:03 PM  Result Value Ref Range   Color, Urine YELLOW YELLOW   APPearance CLEAR CLEAR   Specific Gravity, Urine 1.015 1.005 - 1.030   pH 7.5 5.0 - 8.0   Glucose, UA NEGATIVE NEGATIVE mg/dL   Hgb urine dipstick LARGE (A) NEGATIVE   Bilirubin Urine NEGATIVE NEGATIVE   Ketones, ur NEGATIVE NEGATIVE mg/dL   Protein, ur NEGATIVE NEGATIVE mg/dL   Urobilinogen, UA 0.2 0.0 - 1.0 mg/dL   Nitrite NEGATIVE NEGATIVE   Leukocytes, UA NEGATIVE NEGATIVE  Urine microscopic-add on     Status: None   Collection Time: 12/03/14 11:03 PM  Result Value Ref Range   Squamous Epithelial  / LPF RARE RARE   RBC / HPF 3-6 <3 RBC/hpf   Bacteria, UA RARE RARE   Urine-Other MUCOUS PRESENT   Pregnancy, urine POC     Status: None   Collection Time: 12/03/14 11:17 PM  Result Value Ref Range   Preg Test, Ur NEGATIVE NEGATIVE  hCG, quantitative, pregnancy     Status: Abnormal   Collection Time: 12/04/14 12:36 AM  Result Value Ref Range   hCG, Beta Chain, Quant, S 9 (H) <5 mIU/mL  CBC     Status: Abnormal   Collection Time: 12/04/14 12:36 AM  Result Value Ref Range   WBC 11.3 (H) 4.0 - 10.5 K/uL   RBC 4.55 3.87 - 5.11 MIL/uL   Hemoglobin 10.2 (L) 12.0 - 15.0 g/dL   HCT 16.132.9 (L) 09.636.0 - 04.546.0 %   MCV 72.3 (L) 78.0 - 100.0 fL   MCH 22.4 (L) 26.0 - 34.0 pg   MCHC 31.0 30.0 - 36.0 g/dL   RDW 40.916.4 (H) 81.111.5 - 91.415.5 %   Platelets 309 150 - 400 K/uL  ABO/Rh     Status: None (Preliminary result)   Collection Time: 12/04/14 12:36 AM  Result Value Ref Range   ABO/RH(D) O POS    [place] Ob Comp Less 14 Wks  12/04/2014   CLINICAL DATA:  Spotting since early December. Bleeding since yesterday. Estimated gestational age by LMP is 8 weeks 0 days. Quantitative beta HCG is 9. Decreasing levels reported since 11/24/2014.  EXAM: OBSTETRIC <14 WK US AND TRANSVAGINAL OB US  TECHNIQUE: Both transabdominal and transvaginal ultrasound examinations were performed for complete evaluation of the gestation as well as the maternal uterus, adnexal regions, and pelvic cul-de-sac. Transvaginal technique was performed to assess early pregnancy.  COMPARISON:  None.  FINDINGS: Intrauterine gestational sac: No intrauterine gestational sac identified.  Yolk sac:  Not identified.  Embryo:  Not identified.  Cardiac Activity: Not identified.  Maternal uterus/adnexae: Uterus is mildly anteverted. No myometrial mass lesions identified. Endometrial stripe thickness is normal. Small nabothian cysts in the cervix. Both ovaries are visualized and appear normal. No abnormal adnexal masses. Minimal free fluid in the pelvis.  IMPRESSION:  No intrauterine pregnancy is demonstrated. Given the history of declining beta HCG levels, this likely indicates intrauterine fetal demise.   Electronically Signed   By: Burman NievesWilliam  Stevens M.D.   On: 12/04/2014 03:01   [place] Ob Transvaginal  12/04/2014   CLINICAL DATA:  Spotting since early December. Bleeding since yesterday. Estimated gestational age by LMP is 8 weeks 0 days. Quantitative beta HCG is 9. Decreasing levels reported since 11/24/2014.  EXAM: OBSTETRIC <14 WK US AND TRANSVAGINAL OB US  TECHNIQUE: Both transabdominal and transvaginal ultrasound  examinations were performed for complete evaluation of the gestation as well as the maternal uterus, adnexal regions, and pelvic cul-de-sac. Transvaginal technique was performed to assess early pregnancy.  COMPARISON:  None.  FINDINGS: Intrauterine gestational sac: No intrauterine gestational sac identified.  Yolk sac:  Not identified.  Embryo:  Not identified.  Cardiac Activity: Not identified.  Maternal uterus/adnexae: Uterus is mildly anteverted. No myometrial mass lesions identified. Endometrial stripe thickness is normal. Small nabothian cysts in the cervix. Both ovaries are visualized and appear normal. No abnormal adnexal masses. Minimal free fluid in the pelvis.  IMPRESSION: No intrauterine pregnancy is demonstrated. Given the history of declining beta HCG levels, this likely indicates intrauterine fetal demise.   Electronically Signed   By: Burman NievesWilliam  Stevens M.D.   On: 12/04/2014 03:01   [facility]U Course  Procedures  MDM Dr. Sallye OberKulwa consulted.  She asked that U/S be ordered.  If endometrial stripe is >705mm, she would like pt to receive 800mcg cytotec per vagina.  If <975mm, no treatment required.  Will obtain CBC and blood type prior to discharge.  Pt to f/u in clinic within one week per MD.  Pt has O+ blood type and does not require Rhogam. Hemoglobin stable at 10.2.  Endometrial stripe is normal.  No further treatment required in MAU  Assessment and Plan   A: SAB  PLAN: Discharge to home Expectant management OTC ibuprofen for discomfort. Follow up with Dr. Richardson Doppole within one week Patient may return to MAU as needed or if her condition were to change or worsen Plan discussed with pt and she indicates understanding and agreement.   Bertram Denvereague Clark, Karen E 12/04/2014, 12:27 AM

## 2015-05-21 ENCOUNTER — Other Ambulatory Visit: Payer: Self-pay | Admitting: Obstetrics and Gynecology

## 2015-05-21 DIAGNOSIS — N6320 Unspecified lump in the left breast, unspecified quadrant: Secondary | ICD-10-CM

## 2015-05-26 ENCOUNTER — Other Ambulatory Visit: Payer: Self-pay

## 2015-07-05 ENCOUNTER — Other Ambulatory Visit: Payer: Self-pay

## 2015-10-08 ENCOUNTER — Encounter (HOSPITAL_COMMUNITY): Payer: Self-pay | Admitting: *Deleted
